# Patient Record
Sex: Male | Born: 1990 | Race: White | Hispanic: No | Marital: Single | Smoking: Never smoker
Health system: Southern US, Community
[De-identification: ages and names within clinical notes are randomized; demographics above are authoritative.]

---

## 2017-11-30 ENCOUNTER — Other Ambulatory Visit: Payer: Self-pay

## 2017-11-30 ENCOUNTER — Ambulatory Visit: Payer: Self-pay | Admitting: Family Medicine

## 2017-11-30 ENCOUNTER — Encounter: Payer: Self-pay | Admitting: Family Medicine

## 2017-11-30 VITALS — BP 122/77 | HR 74 | Temp 98.1°F | Ht 73.0 in | Wt 273.0 lb

## 2017-11-30 DIAGNOSIS — M549 Dorsalgia, unspecified: Secondary | ICD-10-CM

## 2017-11-30 MED ORDER — IBUPROFEN 600 MG PO TABS: 600.0000 mg | ORAL_TABLET | Freq: Three times a day (TID) | ORAL | 0 refills | Status: AC | PRN

## 2017-11-30 MED ORDER — CYCLOBENZAPRINE HCL 10 MG PO TABS: 10.0000 mg | ORAL_TABLET | Freq: Three times a day (TID) | ORAL | 0 refills | Status: AC | PRN

## 2017-11-30 NOTE — Patient Instructions (Addendum)
Thoracic Strain Thoracic strain is an injury to the muscles or tendons that attach to the upper back. A strain can be mild or severe. A mild strain may take only 1-2 weeks to heal. A severe strain involves torn muscles or tendons, so it may take 6-8 weeks to heal. Follow these instructions at home:  Rest as needed. Limit your activity as told by your doctor.  If directed, put ice on the injured area: ? Put ice in a plastic bag. ? Place a towel between your skin and the bag. ? Leave the ice on for 20 minutes, 2-3 times per day.  Take over-the-counter and prescription medicines only as told by your doctor.  Begin doing exercises as told by your doctor or physical therapist.  Warm up before being active.  Bend your knees before you lift heavy objects.  Keep all follow-up visits as told by your doctor. This is important. Contact a doctor if:  Your pain is not helped by medicine.  Your pain, bruising, or swelling is getting worse.  You have a fever. Get help right away if:  You have shortness of breath.  You have chest pain.  You have weakness or loss of feeling (numbness) in your legs.  You cannot control when you pee (urinate). This information is not intended to replace advice given to you by your health care provider. Make sure you discuss any questions you have with your health care provider. Document Released: 09/07/2007 Document Revised: 11/21/2015 Document Reviewed: 05/15/2014 Elsevier Interactive Patient Education  [employer]2018 [employer]     If you have lab work done today you will be contacted with your lab results within the next 2 weeks.  If you have not heard from us then please contact us. The fastest way to get your results is to register for My Chart.   IF you received an x-ray today, you will receive an invoice from [facility]. Please contact [facility] at [phone] with questions or concerns regarding your invoice.   IF you received  labwork today, you will receive an invoice from [facility]. Please contact [facility] at [phone] with questions or concerns regarding your invoice.   Our billing staff will not be able to assist you with questions regarding bills from these companies.  You will be contacted with the lab results as soon as they are available. The fastest way to get your results is to activate your My Chart account. Instructions are located on the last page of this paperwork. If you have not heard from us regarding the results in 2 weeks, please contact this office.
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
## 2018-10-31 ENCOUNTER — Other Ambulatory Visit: Payer: Self-pay

## 2018-10-31 DIAGNOSIS — Z20822 Contact with and (suspected) exposure to covid-19: Secondary | ICD-10-CM

## 2018-11-01 LAB — NOVEL CORONAVIRUS, NAA: SARS-CoV-2, NAA: NOT DETECTED

## 2019-09-07 ENCOUNTER — Emergency Department (HOSPITAL_COMMUNITY): Payer: Worker's Compensation

## 2019-09-07 ENCOUNTER — Other Ambulatory Visit: Payer: Self-pay

## 2019-09-07 ENCOUNTER — Emergency Department (HOSPITAL_COMMUNITY)
Admission: EM | Admit: 2019-09-07 | Discharge: 2019-09-08 | Disposition: A | Discharge status: Home / Self Care | Payer: Worker's Compensation | Attending: Emergency Medicine | Admitting: Emergency Medicine

## 2019-09-07 ENCOUNTER — Encounter (HOSPITAL_COMMUNITY): Payer: Self-pay | Admitting: Emergency Medicine

## 2019-09-07 DIAGNOSIS — M79642 Pain in left hand: Secondary | ICD-10-CM

## 2019-09-07 DIAGNOSIS — Y990 Civilian activity done for income or pay: Secondary | ICD-10-CM | POA: Diagnosis not present

## 2019-09-07 DIAGNOSIS — R52 Pain, unspecified: Secondary | ICD-10-CM

## 2019-09-07 DIAGNOSIS — M542 Cervicalgia: Secondary | ICD-10-CM | POA: Insufficient documentation

## 2019-09-07 DIAGNOSIS — M545 Low back pain, unspecified: Secondary | ICD-10-CM

## 2019-09-07 MED ORDER — ACETAMINOPHEN 500 MG PO TABS
1000.0000 mg | ORAL_TABLET | Freq: Once | ORAL | Status: AC
  Administered 2019-09-07: 1000 mg via ORAL
  Filled 2019-09-07: qty 2
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
